# Patient Record
Sex: Male | Born: 1995 | Race: Black or African American | Hispanic: No | Marital: Single | Smoking: Current every day smoker
Health system: Southern US, Community
[De-identification: ages and names within clinical notes are randomized; demographics above are authoritative.]

## PROBLEM LIST (undated history)

## (undated) ENCOUNTER — Ambulatory Visit: Payer: Self-pay

---
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
## 2013-01-08 IMAGING — CR DG CERVICAL SPINE 2 OR 3 VIEWS
3 series · 3 of 3 positions shown · non-contrast
Comparison: None.

CLINICAL DATA: 15-year-old male with neck pain.  No known trauma.

CERVICAL SPINE - 2-3 VIEW

[w c-spine lat]
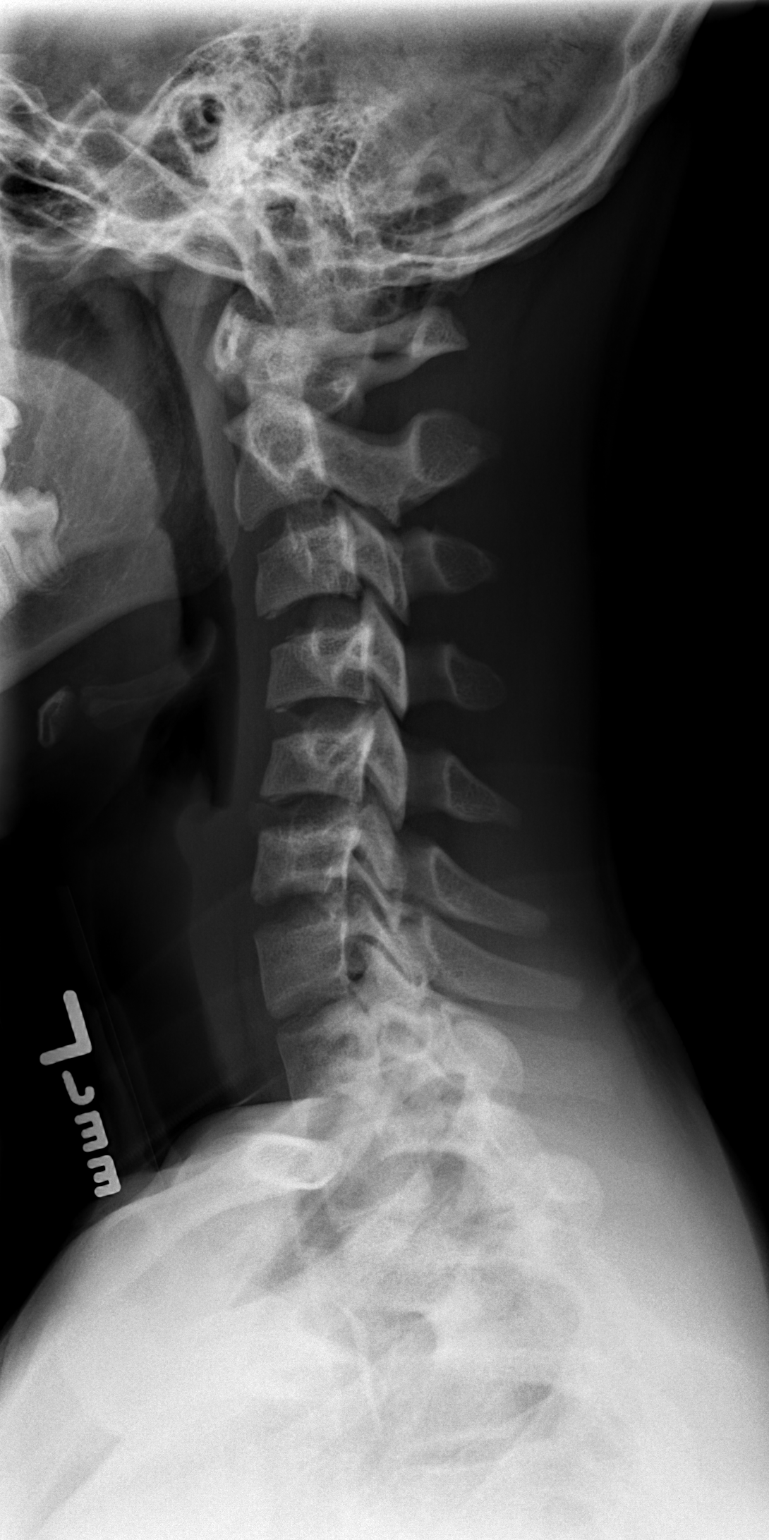

[w c-spine a.p.]
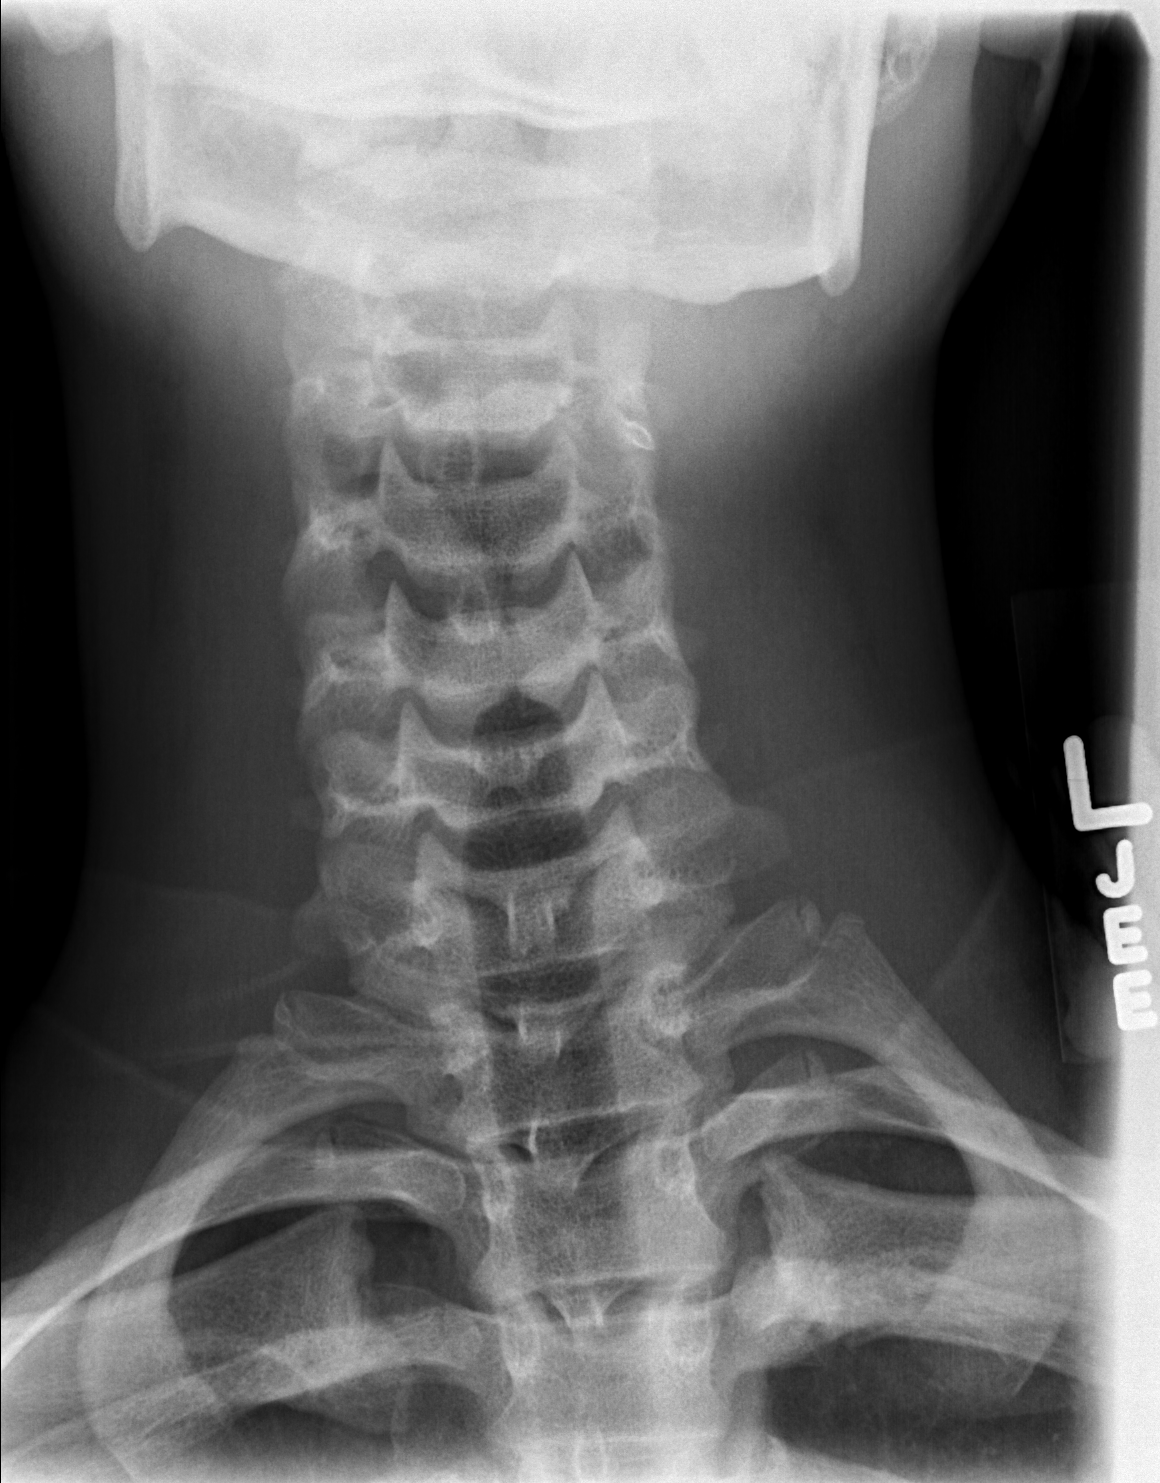

[w c-spine odontoid]
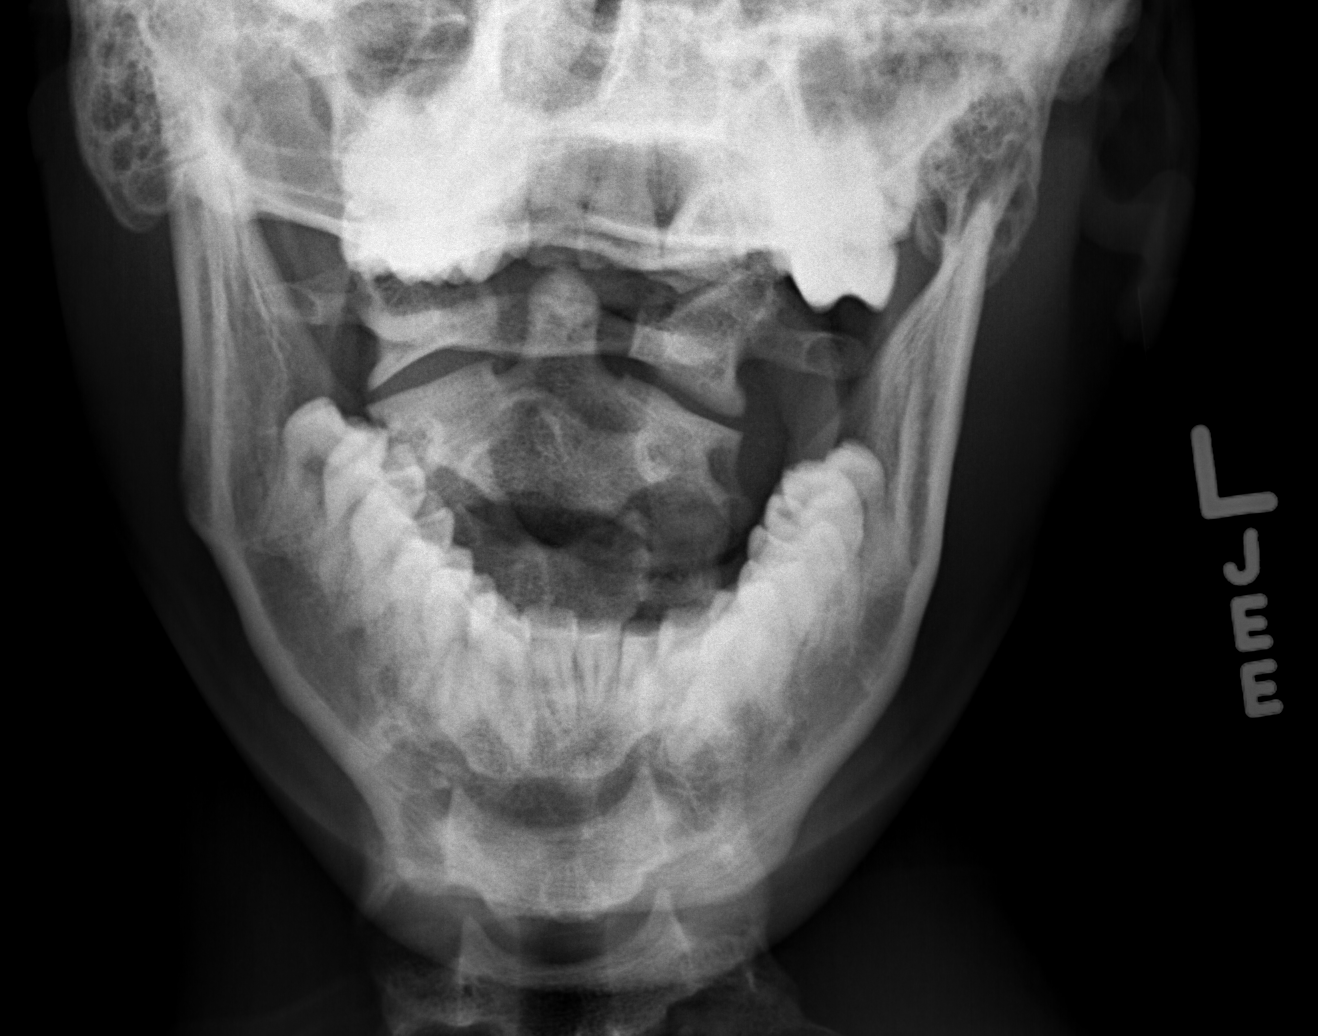

[3 of 3 positions shown; findings below may reference images not displayed]

FINDINGS: Reversal of cervical lordosis.  The patient is nearing
skeletal maturity.  Normal prevertebral soft tissues.
Cervicothoracic junction alignment is within normal limits.
Relatively preserved disc spaces.  Leftward flexion of the neck on
the AP view, otherwise normal AP alignment.  C1-C2 alignment and
odontoid are within normal limits.  Lung apices are within normal
limits.
IMPRESSION: No acute fracture or listhesis identified in the cervical spine.
Ligamentous injury is not excluded.

## 2015-08-04 ENCOUNTER — Encounter (HOSPITAL_COMMUNITY): Payer: Self-pay | Admitting: Emergency Medicine

## 2015-08-04 ENCOUNTER — Emergency Department (HOSPITAL_COMMUNITY)
Admission: EM | Admit: 2015-08-04 | Discharge: 2015-08-04 | Disposition: A | Discharge status: Home / Self Care | Payer: Medicaid Other | Attending: Emergency Medicine | Admitting: Emergency Medicine

## 2015-08-04 DIAGNOSIS — F17200 Nicotine dependence, unspecified, uncomplicated: Secondary | ICD-10-CM | POA: Insufficient documentation

## 2015-08-04 DIAGNOSIS — W503XXA Accidental bite by another person, initial encounter: Secondary | ICD-10-CM

## 2015-08-04 DIAGNOSIS — Y998 Other external cause status: Secondary | ICD-10-CM | POA: Insufficient documentation

## 2015-08-04 DIAGNOSIS — S51851A Open bite of right forearm, initial encounter: Secondary | ICD-10-CM | POA: Insufficient documentation

## 2015-08-04 DIAGNOSIS — T7840XA Allergy, unspecified, initial encounter: Secondary | ICD-10-CM | POA: Insufficient documentation

## 2015-08-04 DIAGNOSIS — Y9289 Other specified places as the place of occurrence of the external cause: Secondary | ICD-10-CM | POA: Insufficient documentation

## 2015-08-04 DIAGNOSIS — S51852A Open bite of left forearm, initial encounter: Secondary | ICD-10-CM | POA: Insufficient documentation

## 2015-08-04 DIAGNOSIS — Y9389 Activity, other specified: Secondary | ICD-10-CM | POA: Insufficient documentation

## 2015-08-04 MED ORDER — AMOXICILLIN-POT CLAVULANATE 875-125 MG PO TABS: 1.0000 | ORAL_TABLET | Freq: Two times a day (BID) | ORAL | Status: AC

## 2015-08-04 MED ORDER — PREDNISONE 20 MG PO TABS: 40.0000 mg | ORAL_TABLET | Freq: Every day | ORAL | Status: AC
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
## 2015-08-04 NOTE — ED Notes (Signed)
Pt. reports generalized itchy rashes onset last week , denies fever , no oral swelling/airway intact . Respirations unlabored.
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
## 2015-08-04 NOTE — ED Notes (Signed)
Declined W/C at D/C and was escorted to lobby by RN. 

## 2015-08-04 NOTE — Discharge Instructions (Signed)
Read instructions below to learn more about your diagnosis and for reasons to return to the ED. Please take benadryl and pepcid (both found over the counter at any local pharmacy or box-store) as needed for itching.  PLEASE Follow up with your doctor if symptoms are not improving after 3-4 days. If you do not have a doctor use the resource guide listed below to help he find one. You may return to the emergency department if symptoms worsen, become progressive, or become more concerning.   Emergency Department Resource Guide  1) Find a Doctor and Pay Out of Pocket Although you won't have to find out who is covered by your insurance plan, it is a good idea to ask around and get recommendations. You will then need to call the office and see if the doctor you have chosen will accept you as a new patient and what types of options they offer for patients who are self-pay. Some doctors offer discounts or will set up payment plans for their patients who do not have insurance, but you will need to ask so you aren't surprised when you get to your appointment.  2) Contact Your Local Health Department Not all health departments have doctors that can see patients for sick visits, but many do, so it is worth a call to see if yours does. If you don't know where your local health department is, you can check in your phone book. The CDC also has a tool to help you locate your state's health department, and many state websites also have listings of all of their local health departments.  3) Find a Walk-in Clinic If your illness is not likely to be very severe or complicated, you may want to try a walk in clinic. These are popping up all over the country in pharmacies, drugstores, and shopping centers. They're usually staffed by nurse practitioners or physician assistants that have been trained to treat common illnesses and complaints. They're usually fairly quick and inexpensive. However, if you have serious medical  issues or chronic medical problems, these are probably not your best option.  No Primary Care Doctor: - Call [facility] at  [phone]: they can help you locate a primary care doctor that  accepts your insurance, provides certain services, etc. - Physician Referral Service: [phone]  Chronic Pain Problems: Organization         Address  Phone   Notes  [facility]  [phone] Patients need to be referred by their primary care doctor.   Medication Assistance: Organization         Address  Phone   Notes  [facility] Medication [facility] [address] [place] [place], [place] [address] [phone] --Must be a resident of [facility] -- Must have NO insurance coverage whatsoever (no Medicaid/ Medicare, etc.) -- The pt. MUST have a primary care doctor that directs their care regularly and follows them in the community   [facility]  [phone]   [employer]  [phone]    Agencies that provide inexpensive medical care: Organization         Address  Phone   Notes  [facility]  [phone]   [facility]    [phone]   [facility] [address] [place], [place] [address] [phone]   [facility] [address] [place], [place] [phone]   [facility]    [phone]  [facility]    [phone]   [facility] and [facility]  [address], [place] Phone:  [phone], Fax:  [phone] Hours of Operation:  9 am - 6 pm, M-F.  Also [phone] and self-pay.  [facility]  [address], [place] Phone: [phone], Fax: [phone]. Hours of Operation:  8:30 am - 5:30 pm, M-F.  Also accepts Medicaid and self-pay.  [facility] [place] [address], [place] Phone: [phone]   [facility] [address] Natasha Bence [place], [place]  [phone], Ext. [phone] Mondays & Thursdays: 7-9 AM.  First 15 patients are seen on a first come, first serve basis.    Medicaid-accepting [facility] Providers:  Organization         Address  Phone   Notes  [facility] [address],  [phone] Also accepts self-pay patients.  [facility] [address] Laurell Josephs [place], [place]  [phone]   [facility] [address], [place] [phone]   [facility] [address], [place] [phone]   Renaye Rakers [address], [place]   [phone] Only accepts [place] patients after they have their name applied to their card.   Self-Pay (no insurance) in [facility]:  Organization         Address  Phone   Notes  Sickle Cell Patients, [facility] [address] [place], [place] [phone]   [facility] [address] [place], [place] [phone]   [facility]  [address], [place] [phone]   [facility]/Dr. Osei-Bonsu  [address] [place] or [address], [place] [phone] Phone number for both [place] and [place] locations is the same.  [facility] and [facility] [address] [place] [phone]   [facility] [address], [place] or [address] [phone]   [facility] [address], [place] [phone], phone; [phone], fax Sees patients 1st and 3rd Saturday of every month.  Must not qualify for public or private insurance (i.e. Medicaid, Medicare, [facility], Veterans' Benefits)  Household income should be no more than 200% of the poverty level The clinic cannot treat you if you are pregnant or think you are pregnant  Sexually transmitted  diseases are not treated at the clinic.    Allergic Reaction  There are many allergens around [place]. It may be difficult to know what caused your reaction. You may follow up with an allergy specialist for further testing to learn more about your specific allergies.   TREATMENT AND HOME CARE INSTRUCTIONS  If hives or rash are present:  Use an over-the-counter antihistamine (Benadryl or Zyrtec) for hives and itching as needed. Do not drive or drink alcohol until medications used to treat the reaction have worn off. Antihistamines tend to make people sleepy.  Apply cold cloths (compresses) to the skin or take baths in cool water. This will help itching. Avoid hot baths or showers. Heat will make a rash and itching worse.  See Your Primary Care Doctor if:  Your allergies are becoming progressively more troublesome.  You suspect a food allergy. Symptoms generally  happen within 30 minutes of eating a food.  Your symptoms have not gone away within 2 days.  SEEK IMMEDIATE MEDICAL CARE IF:  You develop difficulty breathing or wheezing, or have a tight feeling in your chest or throat (feeling like your throat is closing) You develop swollen lips or tongue You develop hives on your chest, neck or face. You are unable to swallow fluids or salvia secretions.   A severe reaction with any of the above problems should be considered life-threatening. If you suddenly develop difficulty breathing call for local emergency medical help. THIS IS AN EMERGENCY.
